# Patient Record
Sex: Male | Born: 2000 | Race: Black or African American | Hispanic: No | Marital: Single | Smoking: Never smoker
Health system: Southern US, Community
[De-identification: ages and names within clinical notes are randomized; demographics above are authoritative.]

## PROBLEM LIST (undated history)

## (undated) DIAGNOSIS — R011 Cardiac murmur, unspecified: Secondary | ICD-10-CM

## (undated) HISTORY — PX: NO PAST SURGERIES: SHX2092

## (undated) HISTORY — DX: Cardiac murmur, unspecified: R01.1

---
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
## 2018-06-23 IMAGING — US US BREAST*R* LIMITED INC AXILLA
1 series · 12 of 12 positions shown · non-contrast
Comparison: None.

CLINICAL DATA: Tender mass felt by the patient in the retroareolar
region of the right breast for the past 2 weeks.

EXAM:
ULTRASOUND OF THE RIGHT BREAST

[Series 1: us breast*right* limited inc axilla · 0.06mm/px · 12 of 12 slices shown]
[im 1/12]
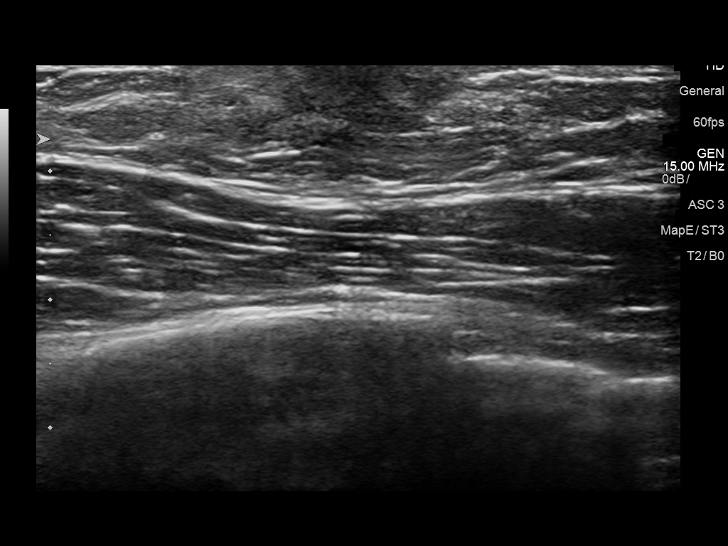
[im 2/12]
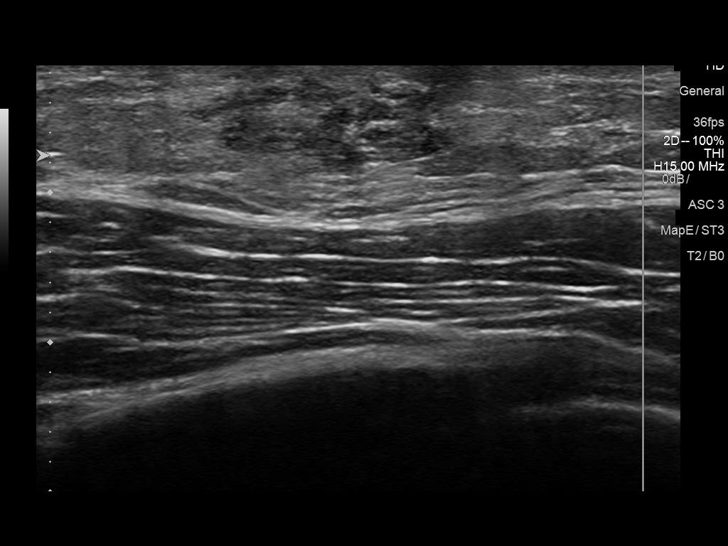
[im 3/12]
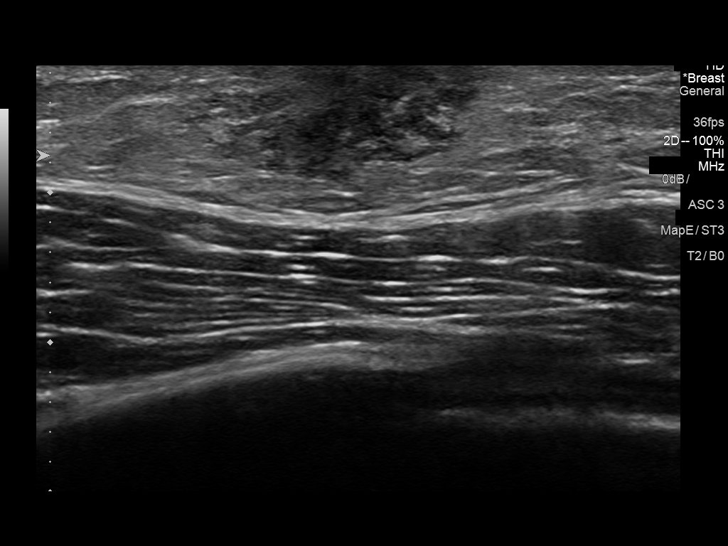
[im 4/12]
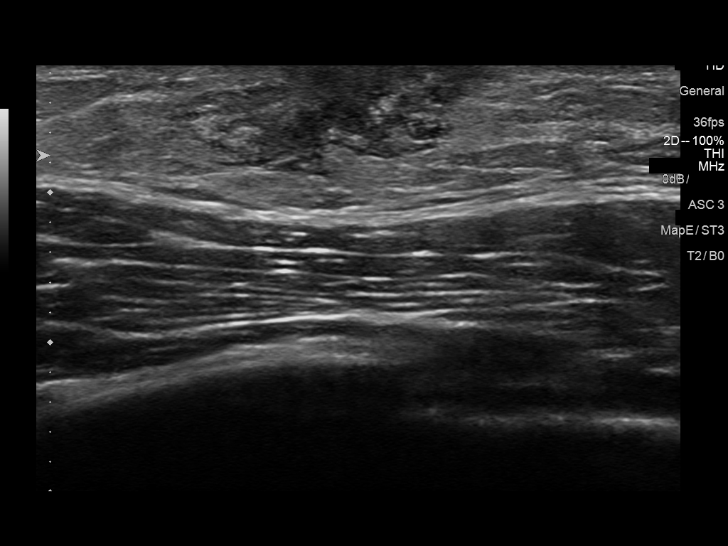
[im 5/12]
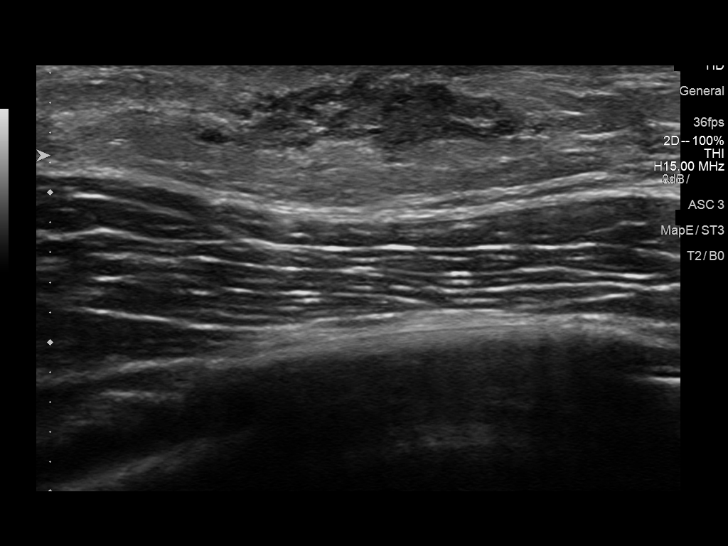
[im 6/12]
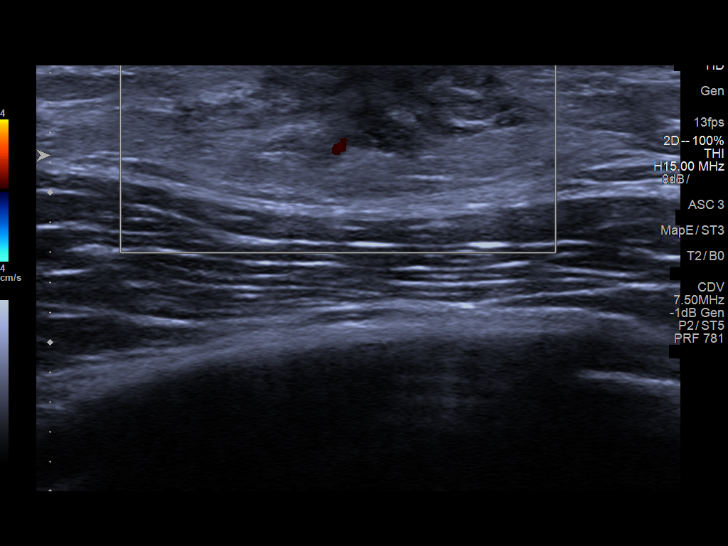
[im 7/12]
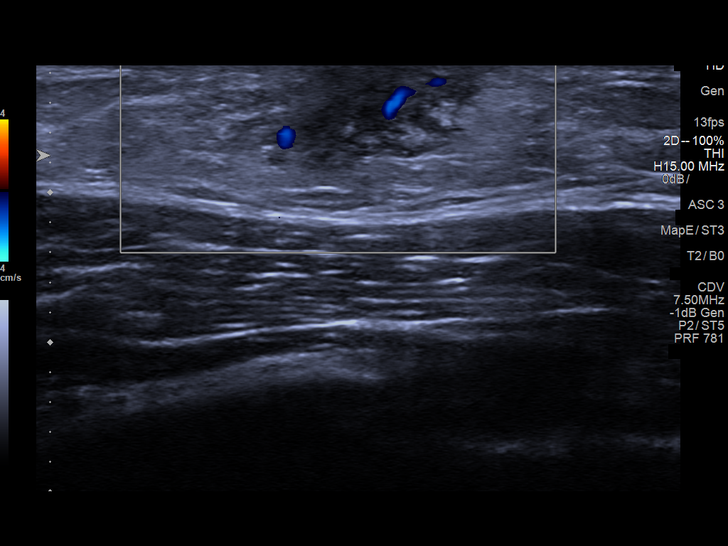
[im 8/12]
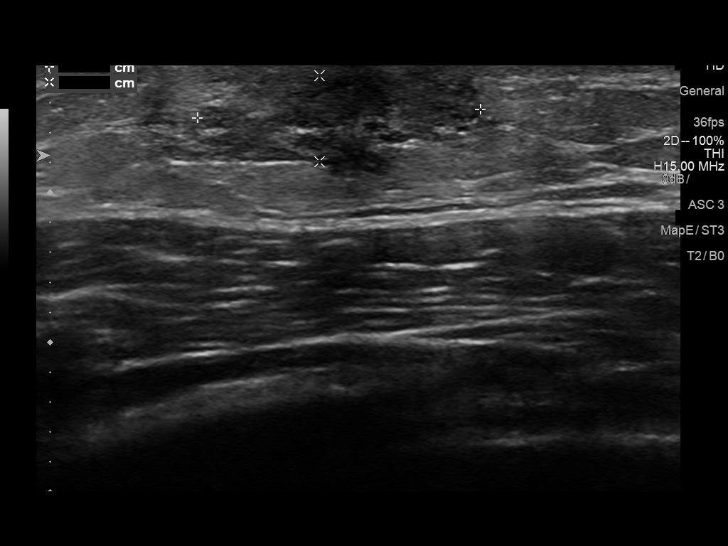
[im 9/12]
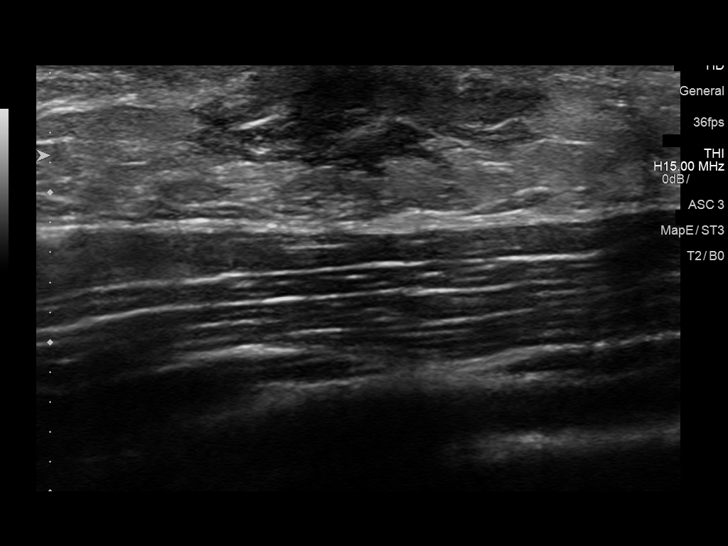
[im 10/12]
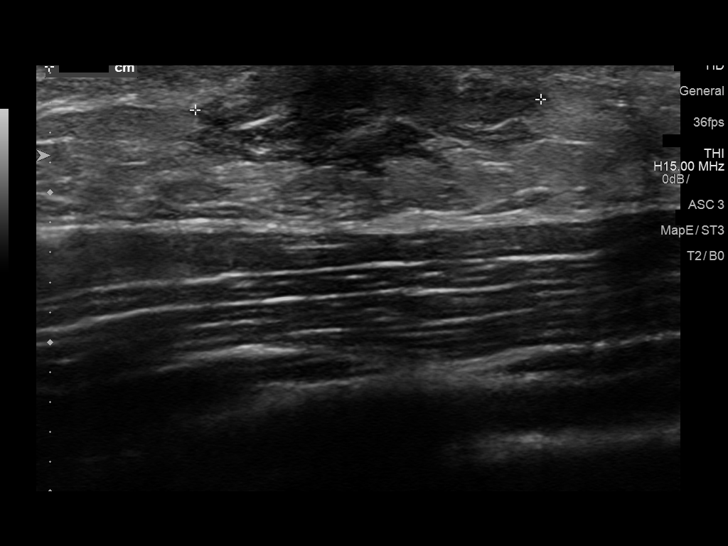
[im 11/12]
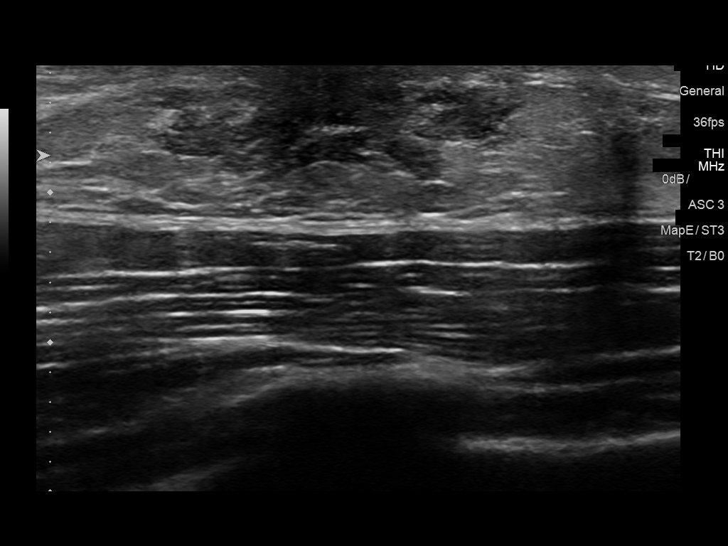
[im 12/12]
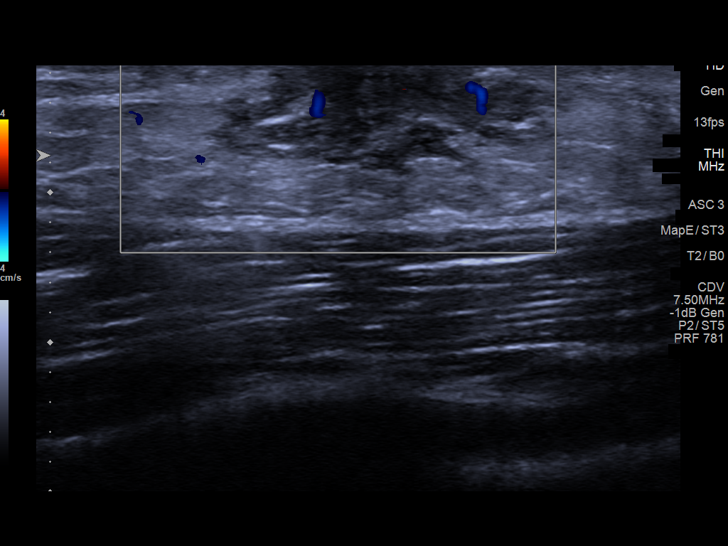

[12 of 12 positions shown; findings below may reference images not displayed]

FINDINGS: On physical exam, the patient has an approximately 1.5 cm oval area
of rubbery, palpable soft tissue thickening in the retroareolar
region of the right breast, centered superiorly. This is mildly
tender to palpation today.

Targeted ultrasound is performed, showing normal appearing
retroareolar glandular tissue corresponding to the soft tissue
thickening in the retroareolar right breast.
IMPRESSION: Mild benign right breast gynecomastia.  No evidence of malignancy.

RECOMMENDATION:
Clinical followup.

I have discussed the findings and recommendations with the patient.
Results were also provided in writing at the conclusion of the
visit. If applicable, a reminder letter will be sent to the patient
regarding the next appointment.

BI-RADS CATEGORY  2: Benign.
